# Patient Record
Sex: Male | Born: 1970 | Race: White | Hispanic: No | Marital: Married | Smoking: Former smoker
Health system: Southern US, Community
[De-identification: ages and names within clinical notes are randomized; demographics above are authoritative.]

## PROBLEM LIST (undated history)

## (undated) HISTORY — PX: VASECTOMY: SHX75

---

## 2009-06-19 ENCOUNTER — Emergency Department: Payer: Self-pay | Admitting: Emergency Medicine

## 2020-05-31 ENCOUNTER — Other Ambulatory Visit: Payer: Self-pay

## 2020-05-31 ENCOUNTER — Emergency Department
Admission: EM | Admit: 2020-05-31 | Discharge: 2020-05-31 | Disposition: A | Discharge status: Home / Self Care | Payer: No Typology Code available for payment source | Attending: Emergency Medicine | Admitting: Emergency Medicine

## 2020-05-31 ENCOUNTER — Emergency Department: Payer: No Typology Code available for payment source

## 2020-05-31 DIAGNOSIS — U071 COVID-19: Secondary | ICD-10-CM

## 2020-05-31 DIAGNOSIS — R0789 Other chest pain: Secondary | ICD-10-CM | POA: Diagnosis present

## 2020-05-31 LAB — CBC
HCT: 45.5 % (ref 39.0–52.0)
Hemoglobin: 15.7 g/dL (ref 13.0–17.0)
MCH: 30.5 pg (ref 26.0–34.0)
MCHC: 34.5 g/dL (ref 30.0–36.0)
MCV: 88.3 fL (ref 80.0–100.0)
Platelets: 203 10*3/uL (ref 150–400)
RBC: 5.15 MIL/uL (ref 4.22–5.81)
RDW: 12.9 % (ref 11.5–15.5)
WBC: 10.5 10*3/uL (ref 4.0–10.5)
nRBC: 0 % (ref 0.0–0.2)

## 2020-05-31 LAB — BASIC METABOLIC PANEL
Anion gap: 10 (ref 5–15)
BUN: 10 mg/dL (ref 6–20)
CO2: 28 mmol/L (ref 22–32)
Calcium: 9.4 mg/dL (ref 8.9–10.3)
Chloride: 101 mmol/L (ref 98–111)
Creatinine, Ser: 0.88 mg/dL (ref 0.61–1.24)
GFR calc Af Amer: >60 mL/min (ref >60)
GFR calc non Af Amer: >60 mL/min (ref >60)
Glucose, Bld: 99 mg/dL (ref 70–99)
Potassium: 4.1 mmol/L (ref 3.5–5.1)
Sodium: 139 mmol/L (ref 135–145)

## 2020-05-31 LAB — TROPONIN I (HIGH SENSITIVITY): Troponin I (High Sensitivity): 3 ng/L (ref <18)

## 2020-05-31 MED ORDER — PREDNISONE 20 MG PO TABS: 40.0000 mg | ORAL_TABLET | Freq: Every day | ORAL | 0 refills | Status: DC

## 2020-05-31 MED ORDER — NAPROXEN 500 MG PO TABS: 500.0000 mg | ORAL_TABLET | Freq: Two times a day (BID) | ORAL | 0 refills | Status: DC

## 2020-05-31 MED ORDER — AZITHROMYCIN 250 MG PO TABS: ORAL_TABLET | ORAL | 0 refills | Status: DC

## 2020-05-31 NOTE — ED Provider Notes (Signed)
[facility] Emergency Department Provider Note  ____________________________________________  Time seen: Approximately 1:45 PM  I have reviewed the triage vital signs and the nursing notes.   HISTORY  Chief Complaint Chest Pain, Shortness of Breath, and Covid Positive    HPI Logan Moss is a 49 y.o. male with no significant past medical history who comes the ED complaining of chest pain and shortness of breath for the past 8 days.  He had a Covid test 7 days ago which was positive.  He is not vaccinated.   Pain in the chest is tightness, not pleuritic,, nonradiating, not exertional.  No significant DOE.  Denies history of DVT or PE, no recent travel trauma hospitalization or surgery.  Pain is waxing and waning, no specific aggravating or alleviating factors, moderate intensity.  Reports appetite is normal and is drinking lots of fluids, no dizziness   Past medical history noncontributory   There are no problems to display for this patient.    Past surgical history noncontributory   Prior to Admission medications   Medication Sig Start Date End Date Taking? Authorizing Provider  azithromycin (ZITHROMAX Z-PAK) 250 MG tablet Take 2 tablets (500 mg) on  Day 1,  followed by 1 tablet (250 mg) once daily on Days 2 through 5. 05/31/20   Sharman Cheek, MD  naproxen (NAPROSYN) 500 MG tablet Take 1 tablet (500 mg total) by mouth 2 (two) times daily with a meal. 05/31/20   Sharman Cheek, MD  predniSONE (DELTASONE) 20 MG tablet Take 2 tablets (40 mg total) by mouth daily. 05/31/20   Sharman Cheek, MD     Allergies Patient has no known allergies.   No family history on file.  Social History Social History   Tobacco Use  . Smoking status: Not on file  Substance Use Topics  . Alcohol use: Not on file  . Drug use: Not on file    Review of Systems  Constitutional:   No fever or chills.  ENT:   No sore throat. No rhinorrhea. Cardiovascular:  Positive chest pain as above without syncope. Respiratory: Positive shortness of breath and nonproductive cough. Gastrointestinal:   Negative for abdominal pain, vomiting and diarrhea.  Musculoskeletal:   Negative for focal pain or swelling All other systems reviewed and are negative except as documented above in ROS and HPI.  ____________________________________________   PHYSICAL EXAM:  VITAL SIGNS: ED Triage Vitals  Enc Vitals Group     BP 05/31/20 1003 (!) 135/91     Pulse Rate 05/31/20 1003 81     Resp 05/31/20 1003 20     Temp 05/31/20 1003 98.2 F (36.8 C)     Temp Source 05/31/20 1003 Oral     SpO2 05/31/20 1003 100 %     Weight 05/31/20 1004 149 lb (67.6 kg)     Height 05/31/20 1004 6' (1.829 m)     Head Circumference --      Peak Flow --      Pain Score 05/31/20 1004 2     Pain Loc --      Pain Edu? --      Excl. in GC? --     Vital signs reviewed, nursing assessments reviewed.   Constitutional:   Alert and oriented. Non-toxic appearance. Eyes:   Conjunctivae are normal. EOMI. PERRL. ENT      Head:   Normocephalic and atraumatic.      Nose:   Wearing a mask.      Mouth/Throat:  Wearing a mask.      Neck:   No meningismus. Full ROM. Hematological/Lymphatic/Immunilogical:   No cervical lymphadenopathy. Cardiovascular:   RRR. Symmetric bilateral radial and DP pulses.  No murmurs. Cap refill less than 2 seconds. Respiratory:   Normal respiratory effort without tachypnea/retractions. Breath sounds are clear and equal bilaterally. No wheezes/rales/rhonchi. Gastrointestinal:   Soft and nontender. Non distended. There is no CVA tenderness.  No rebound, rigidity, or guarding. Musculoskeletal:   Normal range of motion in all extremities. No joint effusions.  No lower extremity tenderness.  No edema. Neurologic:   Normal speech and language.  Motor grossly intact. No acute focal neurologic deficits are appreciated.  Skin:    Skin is warm, dry and intact. No rash  noted.  No petechiae, purpura, or bullae.  ____________________________________________    LABS (pertinent positives/negatives) (all labs ordered are listed, but only abnormal results are displayed) Labs Reviewed  BASIC METABOLIC PANEL  CBC  TROPONIN I (HIGH SENSITIVITY)  TROPONIN I (HIGH SENSITIVITY)   ____________________________________________   EKG  Interpreted by me Normal sinus rhythm rate of 75, normal axis and intervals.  Poor R wave progression.  Normal ST segments and T waves  ____________________________________________    RADIOLOGY  DG Chest 2 View  Result Date: 05/31/2020 CLINICAL DATA:  Chest pain. Reported COVID-19 positive. Shortness of breath. EXAM: CHEST - 2 VIEW COMPARISON:  None. FINDINGS: There is slight scarring in the upper lobes. Lungs elsewhere are clear. Heart size and pulmonary vascularity are normal. No adenopathy. No bone lesions. No pneumothorax. IMPRESSION: Slight upper lobe scarring. Lungs elsewhere clear. Cardiac silhouette normal. Electronically Signed   By: Bretta Bang III M.D.   On: 05/31/2020 11:06    ____________________________________________   PROCEDURES Procedures  ____________________________________________    CLINICAL IMPRESSION / ASSESSMENT AND PLAN / ED COURSE  Medications ordered in the ED: Medications - No data to display  Pertinent labs & imaging results that were available during my care of the patient were reviewed by me and considered in my medical decision making (see chart for details).  Logan Moss was evaluated in Emergency Department on 05/31/2020 for the symptoms described in the history of present illness. He was evaluated in the context of the global COVID-19 pandemic, which necessitated consideration that the patient might be at risk for infection with the SARS-CoV-2 virus that causes COVID-19. Institutional protocols and algorithms that pertain to the evaluation of patients at risk for COVID-19 are  in a state of rapid change based on information released by regulatory bodies including the CDC and federal and state organizations. These policies and algorithms were followed during the patient's care in the ED.   Patient presents with atypical chest pain, shortness of breath in the setting of known Covid disease.  Not in distress, normal vital signs, reassuring exam.  Chest x-ray and labs are also unremarkable.  Oxygenation is 100% on room air.   Considering the patient's symptoms, medical history, and physical examination today, I have low suspicion for ACS, PE, TAD, pneumothorax, carditis, mediastinitis, pneumonia, CHF, or sepsis.  Given duration of symptoms, I will start him on prednisone and Z-Pak, NSAIDs.  Does not meet criteria for monoclonal antibody infusion.  Does not require hospitalization, stable for discharge home.      ____________________________________________   FINAL CLINICAL IMPRESSION(S) / ED DIAGNOSES    Final diagnoses:  COVID-19 virus infection     ED Discharge Orders         Ordered  azithromycin (ZITHROMAX Z-PAK) 250 MG tablet        05/31/20 1345    predniSONE (DELTASONE) 20 MG tablet  Daily        05/31/20 1345    naproxen (NAPROSYN) 500 MG tablet  2 times daily with meals        05/31/20 1345          Portions of this note were generated with dragon dictation software. Dictation errors may occur despite best attempts at proofreading.   Sharman Cheek, MD 05/31/20 1401

## 2020-05-31 NOTE — ED Triage Notes (Signed)
Pt states has covid and is having shob and chest pain. Pt appear in noa cute distress.

## 2020-05-31 NOTE — ED Notes (Addendum)
Assessed by provider prior to d/c. Pt signed paper copy of discharge.

## 2021-07-03 ENCOUNTER — Encounter: Payer: Self-pay | Admitting: Internal Medicine

## 2021-07-03 ENCOUNTER — Other Ambulatory Visit: Payer: Self-pay

## 2021-07-03 ENCOUNTER — Ambulatory Visit (INDEPENDENT_AMBULATORY_CARE_PROVIDER_SITE_OTHER): Payer: No Typology Code available for payment source | Admitting: Internal Medicine

## 2021-07-03 VITALS — BP 132/70 | HR 78 | Ht 72.0 in | Wt 147.2 lb

## 2021-07-03 DIAGNOSIS — F17209 Nicotine dependence, unspecified, with unspecified nicotine-induced disorders: Secondary | ICD-10-CM | POA: Insufficient documentation

## 2021-07-03 DIAGNOSIS — L719 Rosacea, unspecified: Secondary | ICD-10-CM | POA: Diagnosis not present

## 2021-07-03 DIAGNOSIS — L299 Pruritus, unspecified: Secondary | ICD-10-CM | POA: Insufficient documentation

## 2021-07-03 MED ORDER — METRONIDAZOLE 1 % EX GEL: Freq: Every day | CUTANEOUS | 0 refills | Status: AC

## 2021-07-03 NOTE — Progress Notes (Signed)
Date:  07/03/2021   Name:  Logan Moss   DOB:  [birth year]   MRN:  [id number]   Chief Complaint: New Patient (Initial Visit), Rash (Rough brown spot on lower left jaw after shaving. Noticed it about a month ago. Not itchy or painful.), and Pruritis (8-9 years itching on all hair follicles below waste. Used Zyrtec to help but wants to know if there is alternatives.)  Pruritis: For the last 8-9 years patient has had a burning, itching rash around hairs from waist down to ankles. Patient says he takes Zyrtec to try and keep this controlled but the problem still occurs. Patient says hot water, and lotions make the hair follicles burn and itch more. Never seen dermatology before.  His skin will turn red with severe episodes but he never develops hives or pustules.  Zyrtec resolves the sx fairly quickly once they begin.  Keratosis: 1 month ago patient was rubbing his beard and noticed a skin spot on the left jaw line of his face. Says it is not painful or itchy. Has family hx of skin cancer from his grandfather.   Rash - he has redness and occasional raised lesions across his nose and somewhat on the cheeks.  He has never been treated for rosacea.   Lab Results  Component Value Date   CREATININE 0.88 05/31/2020   BUN 10 05/31/2020   NA 139 05/31/2020   K 4.1 05/31/2020   CL 101 05/31/2020   CO2 28 05/31/2020   No results found for: CHOL, HDL, LDLCALC, LDLDIRECT, TRIG, CHOLHDL No results found for: TSH No results found for: HGBA1C Lab Results  Component Value Date   WBC 10.5 05/31/2020   HGB 15.7 05/31/2020   HCT 45.5 05/31/2020   MCV 88.3 05/31/2020   PLT 203 05/31/2020   No results found for: ALT, AST, GGT, ALKPHOS, BILITOT   Review of Systems  Constitutional:  Negative for chills and fatigue.  Respiratory:  Negative for cough, chest tightness and shortness of breath.   Cardiovascular:  Negative for chest pain.  Skin:  Positive for color change and rash.  Neurological:   Negative for dizziness and headaches.  Psychiatric/Behavioral:  Negative for dysphoric mood and sleep disturbance. The patient is not nervous/anxious.    There are no problems to display for this patient.   No Known Allergies  Past Surgical History:  Procedure Laterality Date   VASECTOMY      Social History   Tobacco Use   Smoking status: Every Day    Packs/day: 1.00    Years: 30.00    Pack years: 30.00    Types: Cigarettes   Smokeless tobacco: Never  Vaping Use   Vaping Use: Never used  Substance Use Topics   Alcohol use: Yes    Comment: rare   Drug use: Never     Medication list has been reviewed and updated.  Current Meds  Medication Sig   cetirizine (ZYRTEC) 10 MG tablet Take 10 mg by mouth daily.    PHQ 2/9 Scores 07/03/2021  PHQ - 2 Score 1  PHQ- 9 Score 6    GAD 7 : Generalized Anxiety Score 07/03/2021  Nervous, Anxious, on Edge 0  Control/stop worrying 0  Worry too much - different things 0  Trouble relaxing 1  Restless 1  Easily annoyed or irritable 0  Afraid - awful might happen 0  Total GAD 7 Score 2  Anxiety Difficulty Not difficult at all    BP Readings  from Last 3 Encounters:  07/03/21 132/70  05/31/20 (!) 135/97    Physical Exam Constitutional:      Appearance: Normal appearance.  Neck:     Vascular: No carotid bruit.  Cardiovascular:     Rate and Rhythm: Normal rate and regular rhythm.     Pulses: Normal pulses.  Pulmonary:     Effort: Pulmonary effort is normal.     Breath sounds: No wheezing or rhonchi.  Musculoskeletal:     Cervical back: Normal range of motion.  Lymphadenopathy:     Cervical: No cervical adenopathy.  Skin:    General: Skin is warm and dry.     Capillary Refill: Capillary refill takes less than 2 seconds.     Findings: Erythema (across bridge of nose) present. No lesion (site of possible keratosis now flat and pink) or rash.     Comments: Normal appears hair follicles; no raised lesions, no  dermatographia.  Neurological:     General: No focal deficit present.     Mental Status: He is alert.  Psychiatric:        Mood and Affect: Mood normal.    Wt Readings from Last 3 Encounters:  07/03/21 147 lb 3.2 oz (66.8 kg)  05/31/20 149 lb (67.6 kg)    BP 132/70   Pulse 78   Ht 6' (1.829 m)   Wt 147 lb 3.2 oz (66.8 kg)   SpO2 98%   BMI 19.96 kg/m   Assessment and Plan: 1. Pruritic condition This sounds like temperature induced urticaria without true hives Recommend continuing daily Zyrtec due to excellent control of symptoms. We can refer to Dermatology in the future if needed. Lesion on chin is now resolved - will monitor for recurrence.  2. Rosacea Suspect this as the cause of his facial redness - will try topical agents first - metroNIDAZOLE (METROGEL) 1 % gel; Apply topically daily.  Dispense: 45 g; Refill: 0  Recommend that he return for CPX and HM exam/referrals in the near future.  Partially dictated using Animal nutritionist. Any errors are unintentional.  Bari Edward, MD [facility]  07/03/2021

## 2021-11-05 ENCOUNTER — Encounter: Payer: No Typology Code available for payment source | Admitting: Internal Medicine

## 2021-11-05 NOTE — Progress Notes (Deleted)
Date:  11/05/2021   Name:  Logan Moss   DOB:  09/29/1970   MRN:  [id number]   Chief Complaint: No chief complaint on file. Lon Berner is a 51 y.o. male who presents today for his Complete Annual Exam. He feels {DESC; WELL/FAIRLY WELL/POORLY:18703}. He reports exercising ***. He reports he is sleeping {DESC; WELL/FAIRLY WELL/POORLY:18703}.   Colonoscopy: none   There is no immunization history on file for this patient.  No results found for: PSA1, PSA   HPI  Lab Results  Component Value Date   NA 139 05/31/2020   K 4.1 05/31/2020   CO2 28 05/31/2020   GLUCOSE 99 05/31/2020   BUN 10 05/31/2020   CREATININE 0.88 05/31/2020   CALCIUM 9.4 05/31/2020   GFRNONAA >60 05/31/2020   No results found for: CHOL, HDL, LDLCALC, LDLDIRECT, TRIG, CHOLHDL No results found for: TSH No results found for: HGBA1C Lab Results  Component Value Date   WBC 10.5 05/31/2020   HGB 15.7 05/31/2020   HCT 45.5 05/31/2020   MCV 88.3 05/31/2020   PLT 203 05/31/2020   No results found for: ALT, AST, GGT, ALKPHOS, BILITOT No results found for: 25OHVITD2, 25OHVITD3, VD25OH   Review of Systems  Constitutional:  Negative for appetite change, chills, diaphoresis, fatigue and unexpected weight change.  HENT:  Negative for hearing loss, tinnitus, trouble swallowing and voice change.   Eyes:  Negative for visual disturbance.  Respiratory:  Negative for choking, shortness of breath and wheezing.   Cardiovascular:  Negative for chest pain, palpitations and leg swelling.  Gastrointestinal:  Negative for abdominal pain, blood in stool, constipation and diarrhea.  Genitourinary:  Negative for difficulty urinating, dysuria and frequency.  Musculoskeletal:  Negative for arthralgias, back pain and myalgias.  Skin:  Negative for color change and rash.  Neurological:  Negative for dizziness, syncope and headaches.  Hematological:  Negative for adenopathy.  Psychiatric/Behavioral:  Negative for dysphoric  mood and sleep disturbance. The patient is not nervous/anxious.    Patient Active Problem List   Diagnosis Date Noted   Rosacea 07/03/2021   Pruritic condition 07/03/2021   Tobacco use disorder, continuous 07/03/2021    No Known Allergies  Past Surgical History:  Procedure Laterality Date   VASECTOMY      Social History   Tobacco Use   Smoking status: Every Day    Packs/day: 1.00    Years: 30.00    Pack years: 30.00    Types: Cigarettes   Smokeless tobacco: Never  Vaping Use   Vaping Use: Never used  Substance Use Topics   Alcohol use: Yes    Comment: rare   Drug use: Never     Medication list has been reviewed and updated.  No outpatient medications have been marked as taking for the 11/05/21 encounter (Appointment) with Glean Hess, MD.    [facility] 2/9 Scores 07/03/2021  PHQ - 2 Score 1  PHQ- 9 Score 6    GAD 7 : Generalized Anxiety Score 07/03/2021  Nervous, Anxious, on Edge 0  Control/stop worrying 0  Worry too much - different things 0  Trouble relaxing 1  Restless 1  Easily annoyed or irritable 0  Afraid - awful might happen 0  Total GAD 7 Score 2  Anxiety Difficulty Not difficult at all    BP Readings from Last 3 Encounters:  07/03/21 132/70  05/31/20 (!) 135/97    Physical Exam Vitals and nursing note reviewed.  Constitutional:  Appearance: Normal appearance. He is well-developed.  HENT:     Head: Normocephalic.     Right Ear: Tympanic membrane, ear canal and external ear normal.     Left Ear: Tympanic membrane, ear canal and external ear normal.     Nose: Nose normal.  Eyes:     Conjunctiva/sclera: Conjunctivae normal.     Pupils: Pupils are equal, round, and reactive to light.  Neck:     Thyroid: No thyromegaly.     Vascular: No carotid bruit.  Cardiovascular:     Rate and Rhythm: Normal rate and regular rhythm.     Heart sounds: Normal heart sounds.  Pulmonary:     Effort: Pulmonary effort is normal.     Breath sounds:  Normal breath sounds. No wheezing.  Chest:  Breasts:    Right: No mass.     Left: No mass.  Abdominal:     General: Bowel sounds are normal.     Palpations: Abdomen is soft.     Tenderness: There is no abdominal tenderness.  Musculoskeletal:        General: Normal range of motion.     Cervical back: Normal range of motion and neck supple.  Lymphadenopathy:     Cervical: No cervical adenopathy.  Skin:    General: Skin is warm and dry.  Neurological:     Mental Status: He is alert and oriented to person, place, and time.     Deep Tendon Reflexes: Reflexes are normal and symmetric.  Psychiatric:        Attention and Perception: Attention normal.        Mood and Affect: Mood normal.        Thought Content: Thought content normal.    Wt Readings from Last 3 Encounters:  07/03/21 147 lb 3.2 oz (66.8 kg)  05/31/20 149 lb (67.6 kg)    There were no vitals taken for this visit.  Assessment and Plan:

## 2022-01-31 IMAGING — CR DG CHEST 2V
1 series · 2 of 2 positions shown · non-contrast
Comparison: None.

CLINICAL DATA: Chest pain. Reported [id number] positive. Shortness of
breath.

EXAM:
CHEST - 2 VIEW

[Series 1: dg chest 2 view · 0.14mm/px · 2 of 2 slices shown]
[im 1/2]
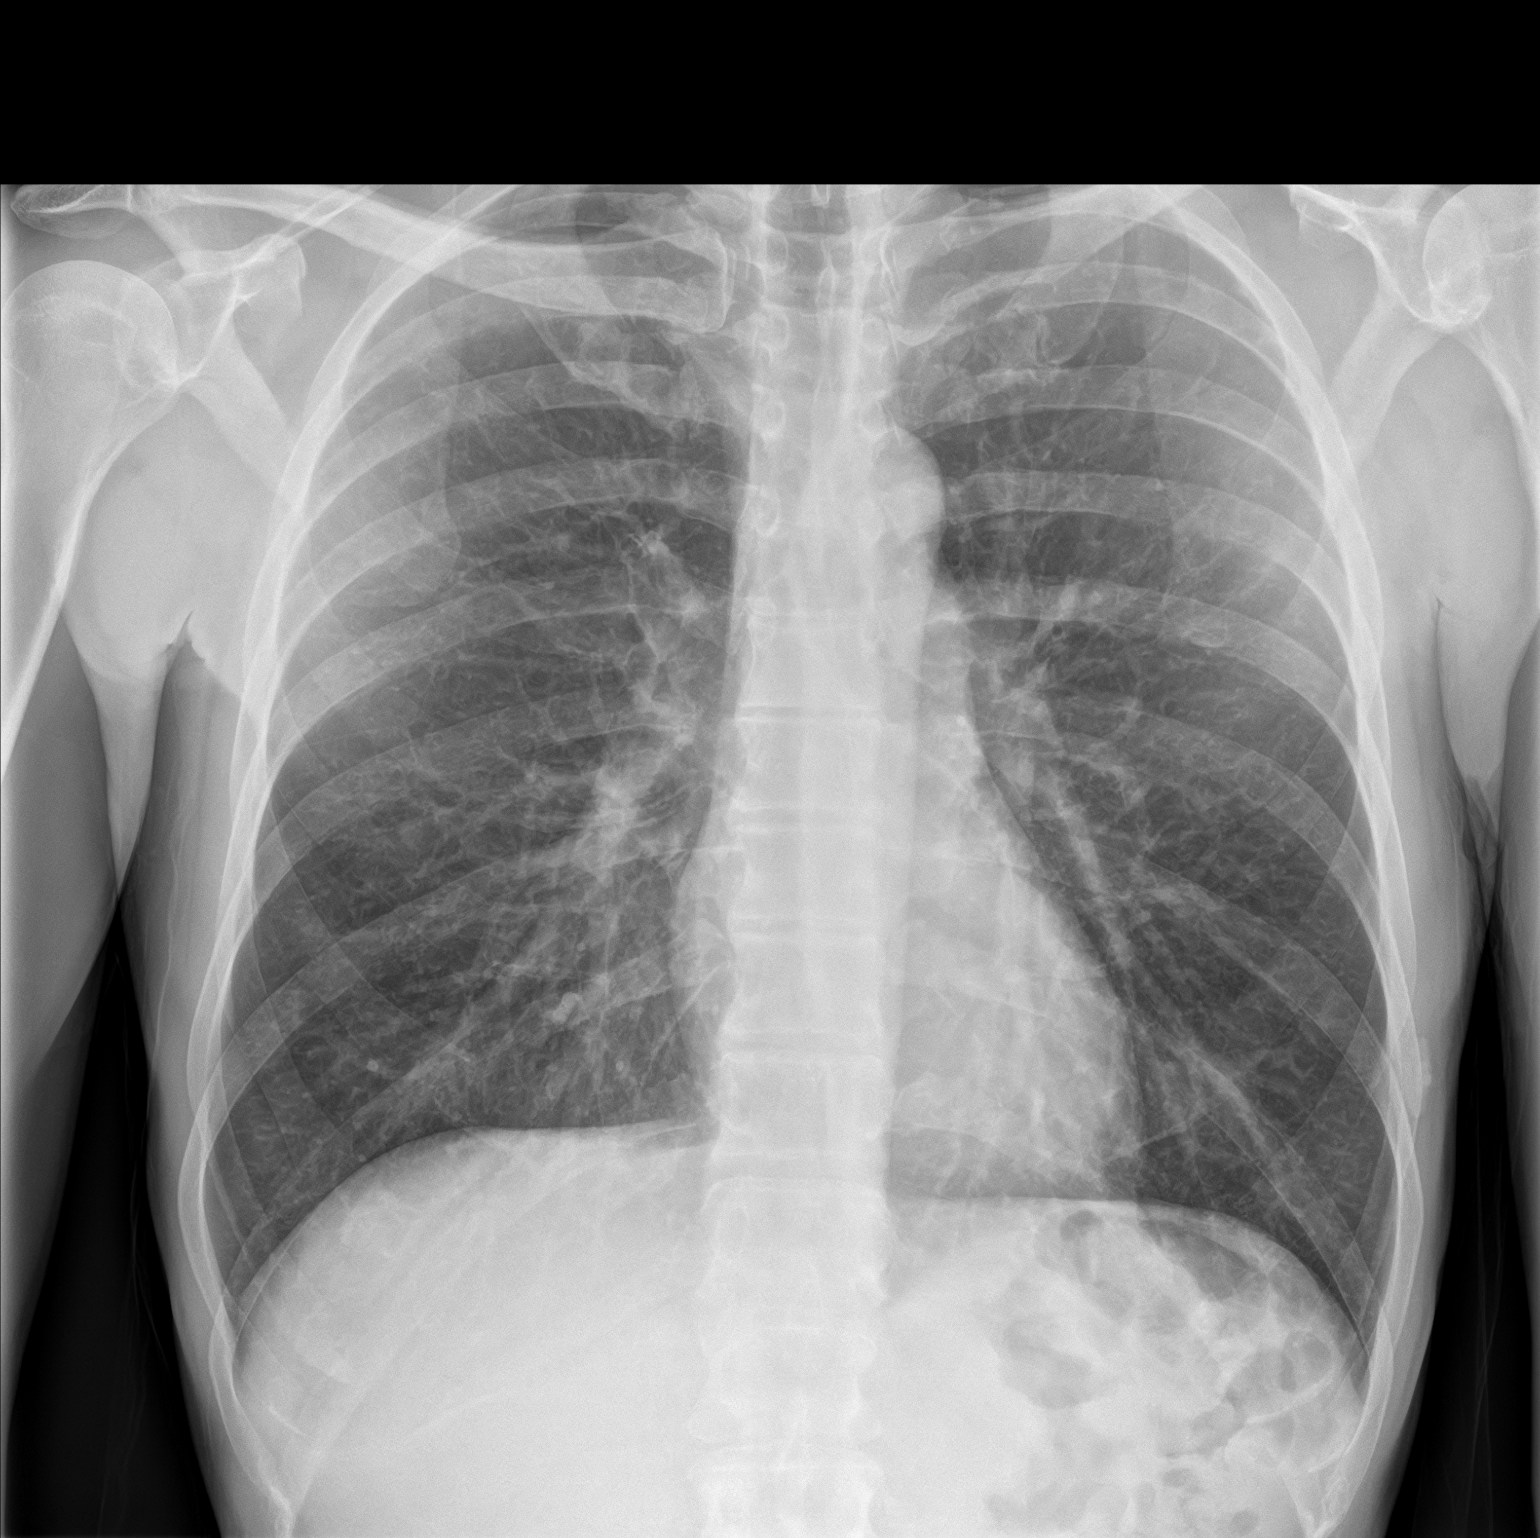
[im 2/2]
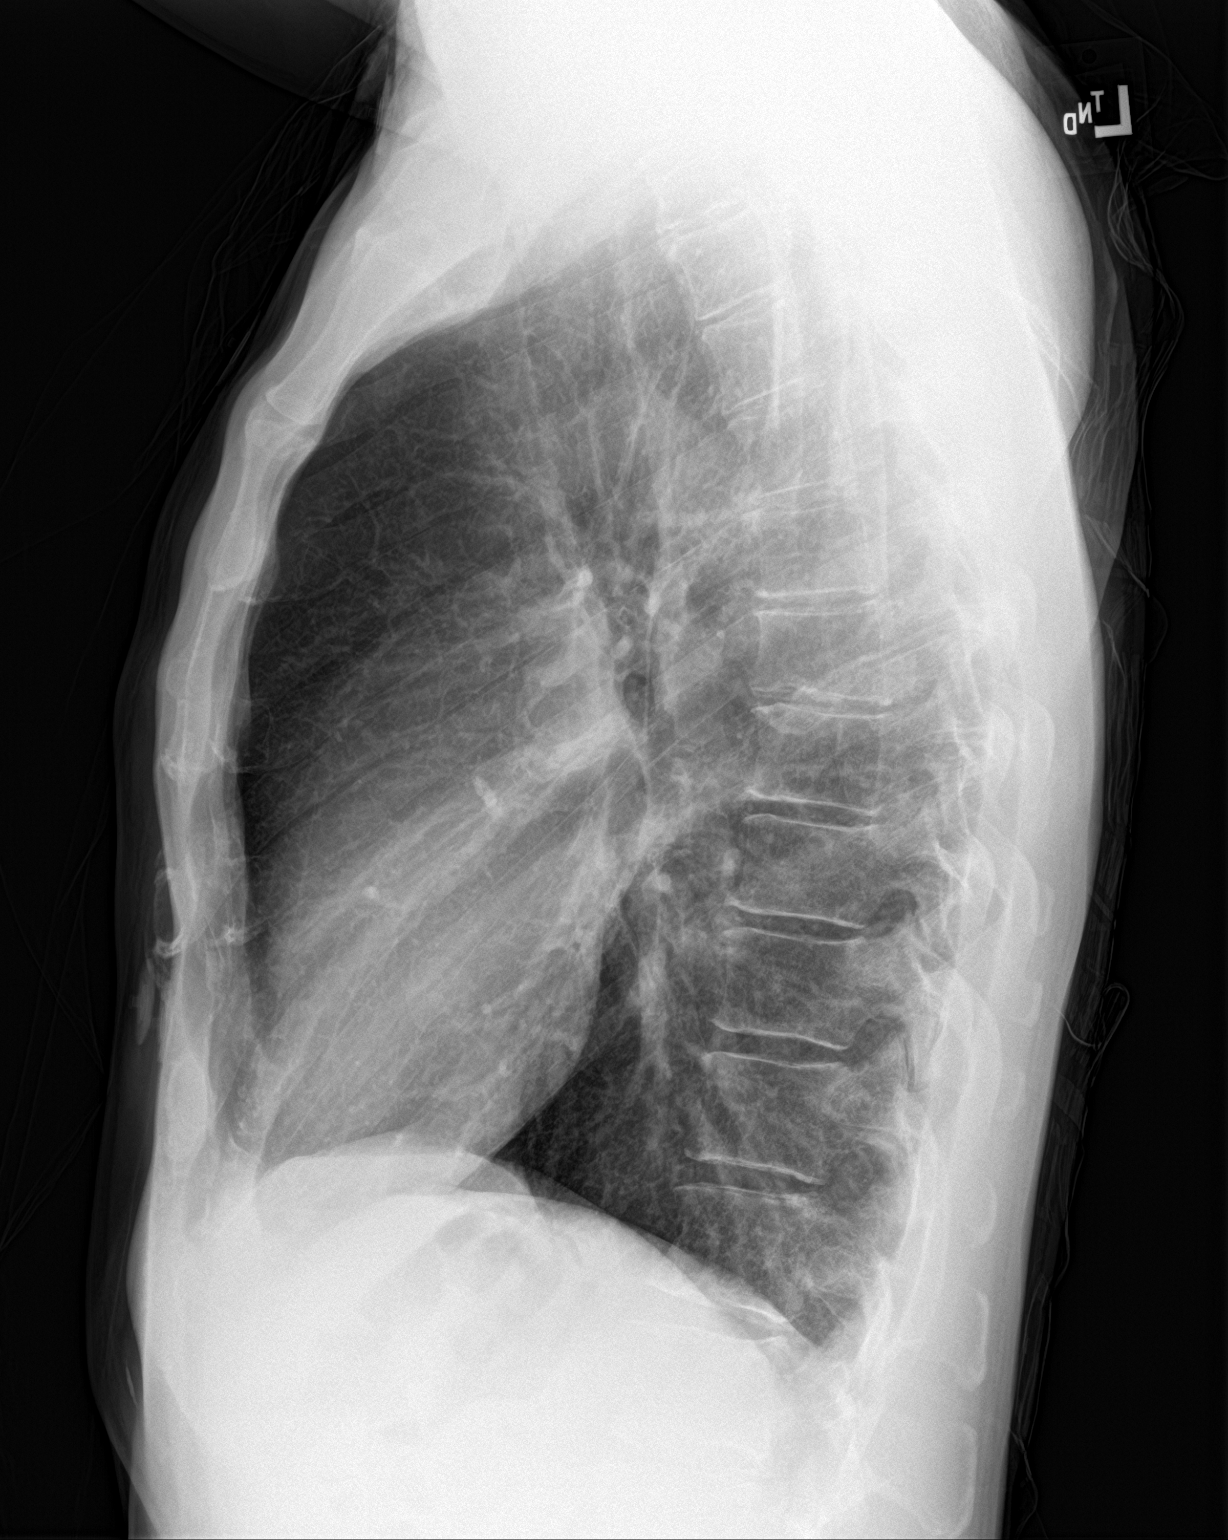

[2 of 2 positions shown; findings below may reference images not displayed]

FINDINGS: There is slight scarring in the upper lobes. Lungs elsewhere are
clear. Heart size and pulmonary vascularity are normal. No
adenopathy. No bone lesions. No pneumothorax.
IMPRESSION: Slight upper lobe scarring. Lungs elsewhere clear. Cardiac
silhouette normal.

## 2022-08-28 ENCOUNTER — Encounter: Payer: Self-pay | Admitting: Emergency Medicine

## 2022-08-28 ENCOUNTER — Other Ambulatory Visit: Payer: Self-pay

## 2022-08-28 ENCOUNTER — Emergency Department: Payer: No Typology Code available for payment source

## 2022-08-28 ENCOUNTER — Emergency Department
Admission: EM | Admit: 2022-08-28 | Discharge: 2022-08-28 | Disposition: A | Discharge status: Home / Self Care | Payer: No Typology Code available for payment source | Attending: Emergency Medicine | Admitting: Emergency Medicine

## 2022-08-28 DIAGNOSIS — R079 Chest pain, unspecified: Secondary | ICD-10-CM

## 2022-08-28 DIAGNOSIS — Z859 Personal history of malignant neoplasm, unspecified: Secondary | ICD-10-CM | POA: Insufficient documentation

## 2022-08-28 DIAGNOSIS — R42 Dizziness and giddiness: Secondary | ICD-10-CM | POA: Diagnosis not present

## 2022-08-28 LAB — TROPONIN I (HIGH SENSITIVITY): Troponin I (High Sensitivity): <2 ng/L (ref <18)

## 2022-08-28 LAB — CBC WITH DIFFERENTIAL/PLATELET
Abs Immature Granulocytes: 0.01 10*3/uL (ref 0.00–0.07)
Basophils Absolute: 0 10*3/uL (ref 0.0–0.1)
Basophils Relative: 1 %
Eosinophils Absolute: 0.2 10*3/uL (ref 0.0–0.5)
Eosinophils Relative: 3 %
HCT: 45.2 % (ref 39.0–52.0)
Hemoglobin: 14.7 g/dL (ref 13.0–17.0)
Immature Granulocytes: 0 %
Lymphocytes Relative: 34 %
Lymphs Abs: 2.2 10*3/uL (ref 0.7–4.0)
MCH: 29.2 pg (ref 26.0–34.0)
MCHC: 32.5 g/dL (ref 30.0–36.0)
MCV: 89.9 fL (ref 80.0–100.0)
Monocytes Absolute: 0.6 10*3/uL (ref 0.1–1.0)
Monocytes Relative: 9 %
Neutro Abs: 3.4 10*3/uL (ref 1.7–7.7)
Neutrophils Relative %: 53 %
Platelets: 308 10*3/uL (ref 150–400)
RBC: 5.03 MIL/uL (ref 4.22–5.81)
RDW: 12.7 % (ref 11.5–15.5)
WBC: 6.4 10*3/uL (ref 4.0–10.5)
nRBC: 0 % (ref 0.0–0.2)

## 2022-08-28 LAB — COMPREHENSIVE METABOLIC PANEL
ALT: 13 U/L (ref 0–44)
AST: 19 U/L (ref 15–41)
Albumin: 4.4 g/dL (ref 3.5–5.0)
Alkaline Phosphatase: 82 U/L (ref 38–126)
Anion gap: 3 — ABNORMAL LOW (ref 5–15)
BUN: 19 mg/dL (ref 6–20)
CO2: 27 mmol/L (ref 22–32)
Calcium: 9.5 mg/dL (ref 8.9–10.3)
Chloride: 109 mmol/L (ref 98–111)
Creatinine, Ser: 0.92 mg/dL (ref 0.61–1.24)
GFR, Estimated: >60 mL/min (ref >60)
Glucose, Bld: 100 mg/dL — ABNORMAL HIGH (ref 70–99)
Potassium: 4.1 mmol/L (ref 3.5–5.1)
Sodium: 139 mmol/L (ref 135–145)
Total Bilirubin: 0.5 mg/dL (ref 0.3–1.2)
Total Protein: 7.2 g/dL (ref 6.5–8.1)

## 2022-08-28 LAB — D-DIMER, QUANTITATIVE: D-Dimer, Quant: <0.27 ug/mL-FEU (ref 0.00–0.50)

## 2022-08-28 NOTE — ED Provider Notes (Signed)
[facility] Provider Note    Event Date/Time   First MD Initiated Contact with Patient 08/28/22 [phone]     (approximate)   History   Chest Pain   HPI  Logan Moss is a 51 y.o. male past medical history of tobacco use who presents with chest pain.  Patient was at work yesterday he was standing and had been standing for some time when he suddenly felt his vision was going black and felt lightheaded.  He then had a syncopal episode but was caught by coworkers that did not fall to the ground.  Since that time he has had some pressure in the left side of his chest.  Describes it as a squeezing pain that does not radiate has been rather constant this is been going on since the syncopal episode yesterday denies dyspnea nausea or diaphoresis.  Pain is nonexertional nonpleuritic.  Denies history of similar.  The patient denies hx of prior DVT/PE, unilateral leg pain/swelling, hormone use, recent surgery, hx of cancer, prolonged immobilization, or hemoptysis.       History reviewed. No pertinent past medical history.  Patient Active Problem List   Diagnosis Date Noted   Rosacea 07/03/2021   Pruritic condition 07/03/2021   Tobacco use disorder, continuous 07/03/2021     Physical Exam  Triage Vital Signs: ED Triage Vitals  Enc Vitals Group     BP 08/28/22 0659 (!) 127/100     Pulse Rate 08/28/22 0659 80     Resp 08/28/22 0659 20     Temp 08/28/22 0659 98 F (36.7 C)     Temp src --      SpO2 08/28/22 0659 97 %     Weight 08/28/22 0657 149 lb (67.6 kg)     Height 08/28/22 0657 6' (1.829 m)     Head Circumference --      Peak Flow --      Pain Score 08/28/22 0657 1     Pain Loc --      Pain Edu? --      Excl. in GC? --     Most recent vital signs: Vitals:   08/28/22 0659 08/28/22 0956  BP: (!) 127/100 (!) 157/113  Pulse: 80 66  Resp: 20 16  Temp: 98 F (36.7 C) 98.5 F (36.9 C)  SpO2: 97% 99%     General: Awake, no distress.  CV:  Good  peripheral perfusion.  No peripheral edema Resp:  Normal effort.  Lungs are clear Abd:  No distention.  Neuro:             Awake, Alert, Oriented x 3  Other:     ED Results / Procedures / Treatments  Labs (all labs ordered are listed, but only abnormal results are displayed) Labs Reviewed  COMPREHENSIVE METABOLIC PANEL - Abnormal; Notable for the following components:      Result Value   Glucose, Bld 100 (*)    Anion gap 3 (*)    All other components within normal limits  CBC WITH DIFFERENTIAL/PLATELET  D-DIMER, QUANTITATIVE  TROPONIN I (HIGH SENSITIVITY)     EKG  EKG interpretation performed by myself: NSR, nml axis, nml intervals, no acute ischemic changes    RADIOLOGY I reviewed and interpreted the CXR which does not show any acute cardiopulmonary process    PROCEDURES:  Critical Care performed: No  Procedures   MEDICATIONS ORDERED IN ED: Medications - No data to display   IMPRESSION / MDM /  ASSESSMENT AND PLAN / ED COURSE  I reviewed the triage vital signs and the nursing notes.                              Patient's presentation is most consistent with acute presentation with potential threat to life or bodily function.  Differential diagnosis includes, but is not limited to, vasovagal syncope, musculoskeletal pain, GERD, PE, ACS, dissection  The patient is a 51 year old male who is relatively healthy but uses tobacco presents with chest pain and a syncopal episode yesterday.  Pain started yesterday after the syncopal episode which sounds primarily vasovagal.  He was standing and had prodrome of vision going out and then syncopized.  He has had a squeezing left-sided chest pain that does not radiate is not exertional and nonpleuritic since that time.  Denies dyspnea diaphoresis or nausea.  Patient's vitals are overall reassuring he looks well no signs of DVT clinically lungs are clear.  EKG showing incomplete right bundle similar to prior no acute ischemic  changes.  Chest x-ray is clear.  Initial troponin less than 2 and with duration of symptoms think that this essentially rules out ACS.  I have low suspicion for PE but given he did syncopized and we have no other explanation for his pain we will obtain a D-dimer out of an abundance of caution.    D-dimer is negative.  With otherwise reassuring EKG and troponin and atypical pain I think that he is appropriate for discharge.   FINAL CLINICAL IMPRESSION(S) / ED DIAGNOSES   Final diagnoses:  Chest pain, unspecified type     Rx / DC Orders   ED Discharge Orders     None        Note:  This document was prepared using Dragon voice recognition software and may include unintentional dictation errors.   Georga Hacking, MD 08/28/22 1535

## 2022-08-28 NOTE — Discharge Instructions (Signed)
Your workup today including your chest x-ray EKG cardiac enzymes and test for blood clots was all reassuring.  You can take Tylenol and Motrin for your chest pain.  If any of your symptoms are worsening you develop shortness of breath or are having multiple episodes or you are passing out please return to the emergency department.  Otherwise please follow-up with your primary care doctor.

## 2022-08-28 NOTE — ED Notes (Signed)
Pt alert and oriented x4, vitals stable. Denies chest pain at this time. Ambulates with a steady gait. Given d/c paperwork. IV d/c'd.

## 2022-08-28 NOTE — ED Triage Notes (Signed)
Patient ambulatory to triage with steady gait, without difficulty or distress noted; pt reports syncopal episode yesterday while at work; denies any recent illness or hx of same; reports has been having some mild left sided CP, nonradiating with no accomp symptoms
